# Patient Record
Sex: Female | Born: 1955 | Race: White | Hispanic: No | Marital: Married
Health system: Southern US, Community
[De-identification: ages and names within clinical notes are randomized; demographics above are authoritative.]

---

## 2008-08-28 ENCOUNTER — Ambulatory Visit: Payer: Self-pay | Admitting: Unknown Physician Specialty

## 2010-01-10 ENCOUNTER — Ambulatory Visit: Payer: Self-pay | Admitting: Unknown Physician Specialty

## 2010-09-04 ENCOUNTER — Ambulatory Visit: Payer: Self-pay

## 2011-08-01 ENCOUNTER — Ambulatory Visit: Payer: Self-pay

## 2011-08-21 ENCOUNTER — Ambulatory Visit: Payer: Self-pay | Admitting: Unknown Physician Specialty

## 2011-08-21 LAB — HEPATIC FUNCTION PANEL A (ARMC)
Alkaline Phosphatase: 121 U/L (ref 50–136)
Bilirubin,Total: 1.8 mg/dL — ABNORMAL HIGH (ref 0.2–1.0)
SGPT (ALT): 112 U/L — ABNORMAL HIGH
Total Protein: 6.8 g/dL (ref 6.4–8.2)

## 2011-08-21 LAB — CBC
HCT: 35.6 % (ref 35.0–47.0)
MCV: 101 fL — ABNORMAL HIGH (ref 80–100)
RBC: 3.53 10*6/uL — ABNORMAL LOW (ref 3.80–5.20)
WBC: 3.4 10*3/uL — ABNORMAL LOW (ref 3.6–11.0)

## 2011-08-21 LAB — URINALYSIS, COMPLETE
Bilirubin,UR: NEGATIVE
Blood: NEGATIVE
Glucose,UR: NEGATIVE mg/dL (ref 0–75)
Ketone: NEGATIVE
Leukocyte Esterase: NEGATIVE
Ph: 5 (ref 4.5–8.0)
RBC,UR: 2 /HPF (ref 0–5)
Specific Gravity: 1.025 (ref 1.003–1.030)
Squamous Epithelial: 1
WBC UR: 3 /HPF (ref 0–5)

## 2011-08-21 LAB — BASIC METABOLIC PANEL
BUN: 13 mg/dL (ref 7–18)
Calcium, Total: 8.3 mg/dL — ABNORMAL LOW (ref 8.5–10.1)
Chloride: 107 mmol/L (ref 98–107)
Co2: 23 mmol/L (ref 21–32)
Creatinine: 0.68 mg/dL (ref 0.60–1.30)
EGFR (African American): >60
EGFR (Non-African Amer.): >60
Glucose: 148 mg/dL — ABNORMAL HIGH (ref 65–99)
Osmolality: 288 (ref 275–301)

## 2011-08-22 ENCOUNTER — Ambulatory Visit: Payer: Self-pay | Admitting: Internal Medicine

## 2011-08-25 ENCOUNTER — Ambulatory Visit: Payer: Self-pay | Admitting: Oncology

## 2011-08-25 LAB — CBC CANCER CENTER
Basophil #: 0 x10 3/mm (ref 0.0–0.1)
Basophil %: 0.7 %
Eosinophil #: 0.1 x10 3/mm (ref 0.0–0.7)
Eosinophil %: 2.3 %
HCT: 39.4 % (ref 35.0–47.0)
HGB: 13.5 g/dL (ref 12.0–16.0)
Lymphocyte %: 35.7 %
MCHC: 34.2 g/dL (ref 32.0–36.0)
MCV: 100 fL (ref 80–100)
Monocyte #: 0.5 x10 3/mm (ref 0.0–0.7)
Neutrophil #: 2.7 x10 3/mm (ref 1.4–6.5)
Neutrophil %: 51.5 %
Platelet: 44 x10 3/mm — ABNORMAL LOW (ref 150–440)
RBC: 3.93 10*6/uL (ref 3.80–5.20)
WBC: 5.3 x10 3/mm (ref 3.6–11.0)

## 2011-08-25 LAB — IRON AND TIBC
Iron Bind.Cap.(Total): 272 ug/dL (ref 250–450)
Iron Saturation: 40 %
Iron: 108 ug/dL (ref 50–170)
Unbound Iron-Bind.Cap.: 164 ug/dL

## 2011-08-25 LAB — FERRITIN: Ferritin (ARMC): 194 ng/mL (ref 8–388)

## 2011-08-29 ENCOUNTER — Ambulatory Visit: Payer: Self-pay | Admitting: Oncology

## 2011-09-09 ENCOUNTER — Ambulatory Visit: Payer: Self-pay

## 2011-09-10 ENCOUNTER — Ambulatory Visit: Payer: Self-pay | Admitting: Unknown Physician Specialty

## 2011-09-11 ENCOUNTER — Ambulatory Visit: Payer: Self-pay | Admitting: Unknown Physician Specialty

## 2011-09-15 LAB — CBC CANCER CENTER
Eosinophil #: 0.5 x10 3/mm (ref 0.0–0.7)
HCT: 40.9 % (ref 35.0–47.0)
MCH: 34.6 pg — ABNORMAL HIGH (ref 26.0–34.0)
MCHC: 34.2 g/dL (ref 32.0–36.0)
Monocyte #: 0.5 x10 3/mm (ref 0.0–0.7)
Monocyte %: 5.5 %
Neutrophil #: 4.9 x10 3/mm (ref 1.4–6.5)
Neutrophil %: 57.9 %
Platelet: 74 x10 3/mm — ABNORMAL LOW (ref 150–440)

## 2011-09-29 ENCOUNTER — Ambulatory Visit: Payer: Self-pay | Admitting: Oncology

## 2011-10-20 LAB — CBC CANCER CENTER
Basophil #: 0 x10 3/mm (ref 0.0–0.1)
Eosinophil %: 3.6 %
HCT: 40 % (ref 35.0–47.0)
HGB: 13.1 g/dL (ref 12.0–16.0)
Lymphocyte %: 37.1 %
MCH: 33.5 pg (ref 26.0–34.0)
MCV: 102 fL — ABNORMAL HIGH (ref 80–100)
Monocyte #: 0.3 x10 3/mm (ref 0.2–0.9)
Monocyte %: 7.6 %
Platelet: 40 x10 3/mm — ABNORMAL LOW (ref 150–440)
RDW: 14.5 % (ref 11.5–14.5)
WBC: 3.4 x10 3/mm — ABNORMAL LOW (ref 3.6–11.0)

## 2011-10-29 ENCOUNTER — Ambulatory Visit: Payer: Self-pay | Admitting: Oncology

## 2011-11-03 LAB — CBC CANCER CENTER
Eosinophil #: 0.2 x10 3/mm (ref 0.0–0.7)
Eosinophil %: 3.8 %
HCT: 41.3 % (ref 35.0–47.0)
HGB: 13.6 g/dL (ref 12.0–16.0)
MCH: 33.4 pg (ref 26.0–34.0)
RBC: 4.07 10*6/uL (ref 3.80–5.20)
RDW: 14.7 % — ABNORMAL HIGH (ref 11.5–14.5)

## 2011-11-29 ENCOUNTER — Ambulatory Visit: Payer: Self-pay | Admitting: Oncology

## 2011-12-01 LAB — CBC CANCER CENTER
Basophil #: 0 x10 3/mm (ref 0.0–0.1)
Eosinophil #: 0.1 x10 3/mm (ref 0.0–0.7)
Eosinophil %: 3.1 %
Lymphocyte #: 1.3 x10 3/mm (ref 1.0–3.6)
Lymphocyte %: 39 %
MCHC: 33.3 g/dL (ref 32.0–36.0)
MCV: 102 fL — ABNORMAL HIGH (ref 80–100)
Monocyte #: 0.3 x10 3/mm (ref 0.2–0.9)
Monocyte %: 8.3 %
Platelet: 49 x10 3/mm — ABNORMAL LOW (ref 150–440)
RDW: 15.4 % — ABNORMAL HIGH (ref 11.5–14.5)
WBC: 3.4 x10 3/mm — ABNORMAL LOW (ref 3.6–11.0)

## 2011-12-15 LAB — CBC CANCER CENTER
Basophil #: 0 x10 3/mm (ref 0.0–0.1)
Basophil %: 1 %
Eosinophil %: 3.5 %
HGB: 12.8 g/dL (ref 12.0–16.0)
Lymphocyte %: 45.3 %
Monocyte #: 0.3 x10 3/mm (ref 0.2–0.9)
Monocyte %: 8.6 %
Neutrophil %: 41.6 %
RBC: 3.72 10*6/uL — ABNORMAL LOW (ref 3.80–5.20)
RDW: 15.3 % — ABNORMAL HIGH (ref 11.5–14.5)
WBC: 3.6 x10 3/mm (ref 3.6–11.0)

## 2011-12-29 ENCOUNTER — Ambulatory Visit: Payer: Self-pay | Admitting: Oncology

## 2012-03-16 ENCOUNTER — Ambulatory Visit: Payer: Self-pay | Admitting: Oncology

## 2012-03-16 LAB — CBC CANCER CENTER
Basophil #: 0 x10 3/mm (ref 0.0–0.1)
Basophil %: 1.1 %
Eosinophil #: 0.2 x10 3/mm (ref 0.0–0.7)
HGB: 13.2 g/dL (ref 12.0–16.0)
Lymphocyte %: 36.4 %
MCHC: 34.3 g/dL (ref 32.0–36.0)
MCV: 100 fL (ref 80–100)
Neutrophil %: 49.2 %
Platelet: 46 x10 3/mm — ABNORMAL LOW (ref 150–440)
RBC: 3.83 10*6/uL (ref 3.80–5.20)
RDW: 15 % — ABNORMAL HIGH (ref 11.5–14.5)

## 2012-03-30 ENCOUNTER — Ambulatory Visit: Payer: Self-pay | Admitting: Oncology

## 2012-05-18 ENCOUNTER — Ambulatory Visit: Payer: Self-pay | Admitting: Oncology

## 2012-05-18 LAB — CBC CANCER CENTER
Basophil #: 0 x10 3/mm (ref 0.0–0.1)
Basophil %: 1 %
Eosinophil %: 3.8 %
HGB: 12.9 g/dL (ref 12.0–16.0)
Lymphocyte %: 31.6 %
MCV: 100 fL (ref 80–100)
Monocyte %: 8.5 %
Neutrophil %: 55.1 %
RBC: 3.94 10*6/uL (ref 3.80–5.20)
WBC: 4.6 x10 3/mm (ref 3.6–11.0)

## 2012-05-30 ENCOUNTER — Ambulatory Visit: Payer: Self-pay | Admitting: Oncology

## 2012-06-11 LAB — CBC CANCER CENTER
Basophil #: 0 x10 3/mm (ref 0.0–0.1)
Basophil %: 1.1 %
Eosinophil #: 0.1 x10 3/mm (ref 0.0–0.7)
HCT: 37.1 % (ref 35.0–47.0)
HGB: 12.9 g/dL (ref 12.0–16.0)
Lymphocyte #: 1.5 x10 3/mm (ref 1.0–3.6)
Lymphocyte %: 36.6 %
MCH: 34.1 pg — ABNORMAL HIGH (ref 26.0–34.0)
MCHC: 34.7 g/dL (ref 32.0–36.0)
Monocyte %: 8.5 %
Neutrophil #: 2.1 x10 3/mm (ref 1.4–6.5)
RDW: 15.9 % — ABNORMAL HIGH (ref 11.5–14.5)
WBC: 4 x10 3/mm (ref 3.6–11.0)

## 2012-06-30 ENCOUNTER — Ambulatory Visit: Payer: Self-pay | Admitting: Oncology

## 2012-09-09 ENCOUNTER — Ambulatory Visit: Payer: Self-pay | Admitting: Oncology

## 2012-09-29 ENCOUNTER — Ambulatory Visit: Payer: Self-pay | Admitting: Medical

## 2013-01-05 ENCOUNTER — Observation Stay: Payer: Self-pay | Admitting: Internal Medicine

## 2013-01-05 LAB — COMPREHENSIVE METABOLIC PANEL WITH GFR
Albumin: 2.5 g/dL — ABNORMAL LOW
Alkaline Phosphatase: 126 U/L
Anion Gap: 14
BUN: 22 mg/dL — ABNORMAL HIGH
Bilirubin,Total: 4.6 mg/dL — ABNORMAL HIGH
Calcium, Total: 8.3 mg/dL — ABNORMAL LOW
Chloride: 108 mmol/L — ABNORMAL HIGH
Co2: 18 mmol/L — ABNORMAL LOW
Creatinine: 0.92 mg/dL
EGFR (African American): >60
EGFR (Non-African Amer.): >60
Glucose: 113 mg/dL — ABNORMAL HIGH
Osmolality: 284
Potassium: 3.2 mmol/L — ABNORMAL LOW
SGOT(AST): 60 U/L — ABNORMAL HIGH
SGPT (ALT): 51 U/L
Sodium: 140 mmol/L
Total Protein: 5.3 g/dL — ABNORMAL LOW

## 2013-01-05 LAB — LIPASE, BLOOD: Lipase: 115 U/L

## 2013-01-05 LAB — CBC
HCT: 37.8 %
HGB: 12.8 g/dL
MCH: 34.4 pg — ABNORMAL HIGH
MCHC: 33.9 g/dL
MCV: 101 fL — ABNORMAL HIGH
Platelet: 46 x10 3/mm 3 — ABNORMAL LOW
RBC: 3.73 X10 6/mm 3 — ABNORMAL LOW
RDW: 17.5 % — ABNORMAL HIGH
WBC: 6.9 x10 3/mm 3

## 2013-01-05 LAB — PROTIME-INR
INR: 1.6
Prothrombin Time: 19.3 s — ABNORMAL HIGH

## 2013-01-06 LAB — URINALYSIS, COMPLETE
Ketone: NEGATIVE
Leukocyte Esterase: NEGATIVE
Ph: 6 (ref 4.5–8.0)
Protein: NEGATIVE
Specific Gravity: 1.025 (ref 1.003–1.030)
Squamous Epithelial: 1

## 2013-01-06 LAB — BASIC METABOLIC PANEL
BUN: 20 mg/dL — ABNORMAL HIGH (ref 7–18)
Calcium, Total: 7.8 mg/dL — ABNORMAL LOW (ref 8.5–10.1)
Chloride: 111 mmol/L — ABNORMAL HIGH (ref 98–107)
Co2: 24 mmol/L (ref 21–32)
EGFR (African American): >60
EGFR (Non-African Amer.): >60
Sodium: 141 mmol/L (ref 136–145)

## 2013-01-07 LAB — BASIC METABOLIC PANEL
Anion Gap: 5 — ABNORMAL LOW (ref 7–16)
BUN: 21 mg/dL — ABNORMAL HIGH (ref 7–18)
Calcium, Total: 8.1 mg/dL — ABNORMAL LOW (ref 8.5–10.1)
EGFR (African American): >60
EGFR (Non-African Amer.): >60
Glucose: 79 mg/dL (ref 65–99)
Osmolality: 278 (ref 275–301)
Potassium: 4 mmol/L (ref 3.5–5.1)

## 2013-01-07 LAB — CBC WITH DIFFERENTIAL/PLATELET
Basophil #: 0 10*3/uL (ref 0.0–0.1)
Eosinophil #: 0 10*3/uL (ref 0.0–0.7)
Eosinophil %: 0.2 %
HCT: 35.4 % (ref 35.0–47.0)
HGB: 12.2 g/dL (ref 12.0–16.0)
Lymphocyte #: 0.5 10*3/uL — ABNORMAL LOW (ref 1.0–3.6)
MCH: 35 pg — ABNORMAL HIGH (ref 26.0–34.0)
MCHC: 34.4 g/dL (ref 32.0–36.0)
MCV: 102 fL — ABNORMAL HIGH (ref 80–100)
Monocyte #: 0.3 x10 3/mm (ref 0.2–0.9)
Monocyte %: 5.9 %
Neutrophil %: 85.6 %
Platelet: 36 10*3/uL — ABNORMAL LOW (ref 150–440)
RBC: 3.48 10*6/uL — ABNORMAL LOW (ref 3.80–5.20)

## 2013-01-08 LAB — BASIC METABOLIC PANEL
Anion Gap: 3 — ABNORMAL LOW (ref 7–16)
BUN: 23 mg/dL — ABNORMAL HIGH (ref 7–18)
Calcium, Total: 8 mg/dL — ABNORMAL LOW (ref 8.5–10.1)
Chloride: 105 mmol/L (ref 98–107)
EGFR (African American): >60
EGFR (Non-African Amer.): >60
Glucose: 118 mg/dL — ABNORMAL HIGH (ref 65–99)
Osmolality: 275 (ref 275–301)
Potassium: 4.7 mmol/L (ref 3.5–5.1)

## 2013-01-08 LAB — CBC WITH DIFFERENTIAL/PLATELET
Basophil #: 0 10*3/uL (ref 0.0–0.1)
Eosinophil %: 0.1 %
HCT: 38 % (ref 35.0–47.0)
HGB: 13 g/dL (ref 12.0–16.0)
Lymphocyte %: 6 %
MCHC: 34.1 g/dL (ref 32.0–36.0)
Platelet: 38 10*3/uL — ABNORMAL LOW (ref 150–440)
WBC: 6.9 10*3/uL (ref 3.6–11.0)

## 2013-01-09 ENCOUNTER — Inpatient Hospital Stay: Payer: Self-pay | Admitting: Internal Medicine

## 2013-01-09 LAB — CBC
MCH: 34.2 pg — ABNORMAL HIGH (ref 26.0–34.0)
MCHC: 33.9 g/dL (ref 32.0–36.0)
MCV: 101 fL — ABNORMAL HIGH (ref 80–100)
Platelet: 42 10*3/uL — ABNORMAL LOW (ref 150–440)
RBC: 4.25 10*6/uL (ref 3.80–5.20)
WBC: 13.8 10*3/uL — ABNORMAL HIGH (ref 3.6–11.0)

## 2013-01-09 LAB — COMPREHENSIVE METABOLIC PANEL
Albumin: 2.1 g/dL — ABNORMAL LOW (ref 3.4–5.0)
Alkaline Phosphatase: 152 U/L — ABNORMAL HIGH (ref 50–136)
Anion Gap: 4 — ABNORMAL LOW (ref 7–16)
BUN: 25 mg/dL — ABNORMAL HIGH (ref 7–18)
Bilirubin,Total: 5.6 mg/dL — ABNORMAL HIGH (ref 0.2–1.0)
Calcium, Total: 8.4 mg/dL — ABNORMAL LOW (ref 8.5–10.1)
Co2: 27 mmol/L (ref 21–32)
Creatinine: 0.78 mg/dL (ref 0.60–1.30)
EGFR (African American): >60
EGFR (Non-African Amer.): >60
Osmolality: 277 (ref 275–301)
SGOT(AST): 450 U/L — ABNORMAL HIGH (ref 15–37)
SGPT (ALT): 225 U/L — ABNORMAL HIGH (ref 12–78)
Sodium: 136 mmol/L (ref 136–145)
Total Protein: 5 g/dL — ABNORMAL LOW (ref 6.4–8.2)

## 2013-01-09 LAB — PROTIME-INR: Prothrombin Time: 19.9 secs — ABNORMAL HIGH (ref 11.5–14.7)

## 2013-01-09 LAB — MAGNESIUM: Magnesium: 2 mg/dL

## 2013-01-09 LAB — TROPONIN I: Troponin-I: <0.02 ng/mL

## 2013-01-09 LAB — APTT: Activated PTT: 27.5 secs (ref 23.6–35.9)

## 2013-01-10 LAB — COMPREHENSIVE METABOLIC PANEL
Albumin: 1.9 g/dL — ABNORMAL LOW (ref 3.4–5.0)
Anion Gap: 6 — ABNORMAL LOW (ref 7–16)
BUN: 27 mg/dL — ABNORMAL HIGH (ref 7–18)
Bilirubin,Total: 6.8 mg/dL — ABNORMAL HIGH (ref 0.2–1.0)
Co2: 26 mmol/L (ref 21–32)
Creatinine: 0.73 mg/dL (ref 0.60–1.30)
EGFR (African American): >60
EGFR (Non-African Amer.): >60
Glucose: 83 mg/dL (ref 65–99)
Osmolality: 276 (ref 275–301)
Potassium: 4.4 mmol/L (ref 3.5–5.1)
SGPT (ALT): 358 U/L — ABNORMAL HIGH (ref 12–78)
Sodium: 136 mmol/L (ref 136–145)
Total Protein: 4.7 g/dL — ABNORMAL LOW (ref 6.4–8.2)

## 2013-01-10 LAB — CBC WITH DIFFERENTIAL/PLATELET
Basophil %: 0.1 %
Eosinophil #: 0 10*3/uL (ref 0.0–0.7)
Eosinophil %: 0.3 %
HCT: 39.6 % (ref 35.0–47.0)
Monocyte #: 0.5 x10 3/mm (ref 0.2–0.9)
Monocyte %: 4.5 %
Neutrophil #: 9.8 10*3/uL — ABNORMAL HIGH (ref 1.4–6.5)
RDW: 17.6 % — ABNORMAL HIGH (ref 11.5–14.5)
WBC: 11.2 10*3/uL — ABNORMAL HIGH (ref 3.6–11.0)

## 2013-01-14 LAB — CULTURE, BLOOD (SINGLE)

## 2013-01-28 DEATH — deceased

## 2013-04-06 IMAGING — US ABDOMEN ULTRASOUND
1 series · 13 of 25 positions shown · non-contrast
Comparison: none

REASON FOR EXAM: generalized abd pain swelling
COMMENTS:

PROCEDURE:     US  - US ABDOMEN GENERAL SURVEY  - September 29, 2012 [DATE]
RESULT:     Comparison: None
TECHNIQUE: Multiple gray-scale and color-flow Doppler images of the abdomen
are presented for review.

[Series 1: abdomen ultrasound · 0.26mm/px · 13 of 118 slices shown]
[im 1/118]
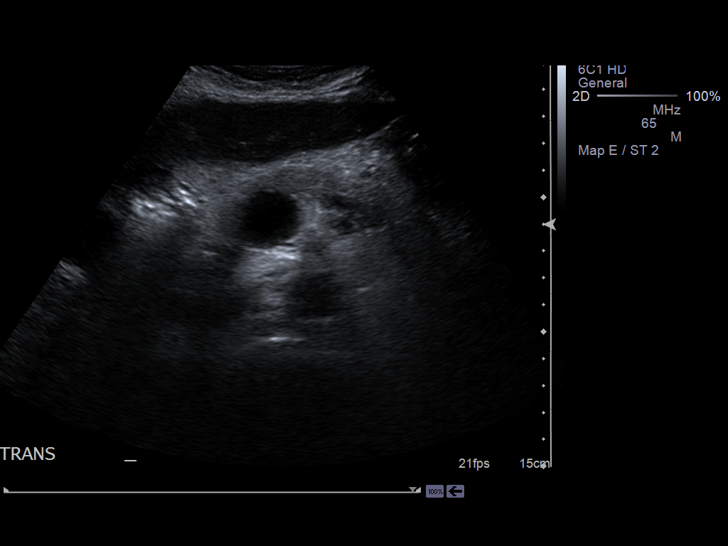
[im 10/118]
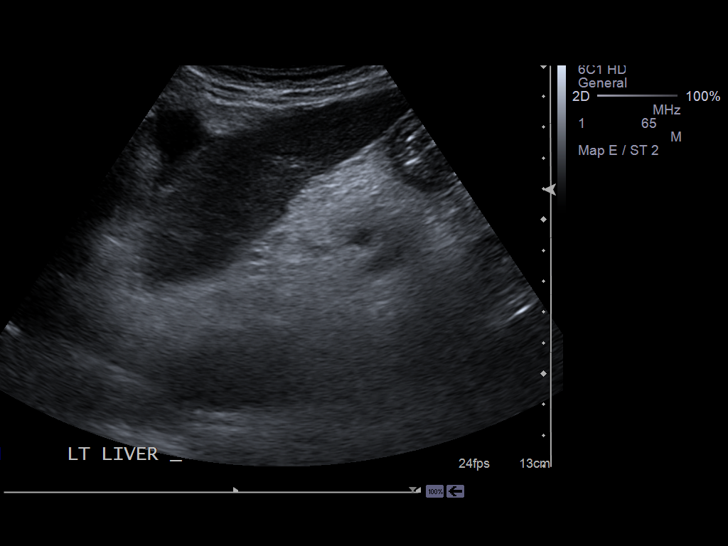
[im 20/118]
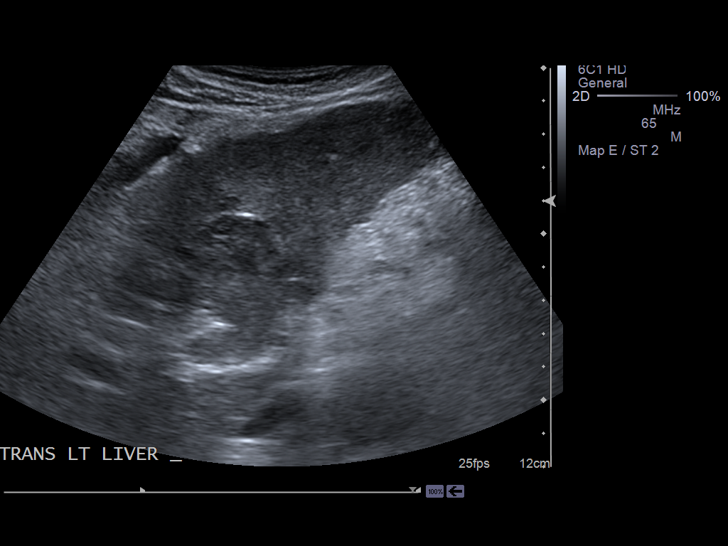
[im 30/118]
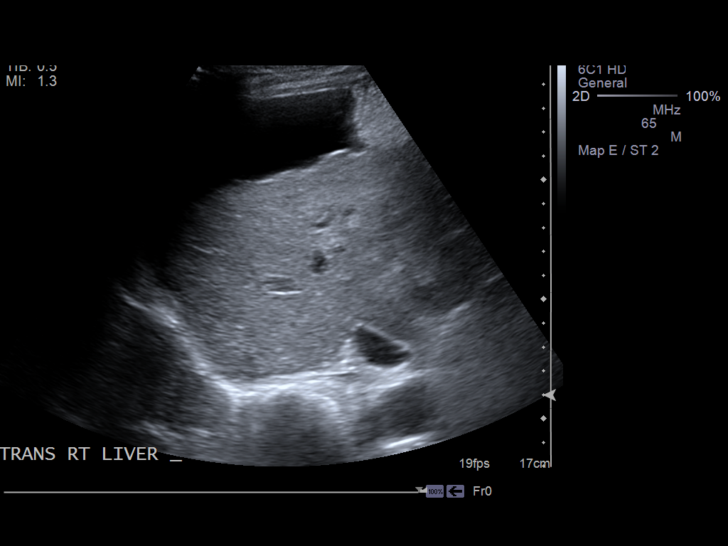
[im 40/118]
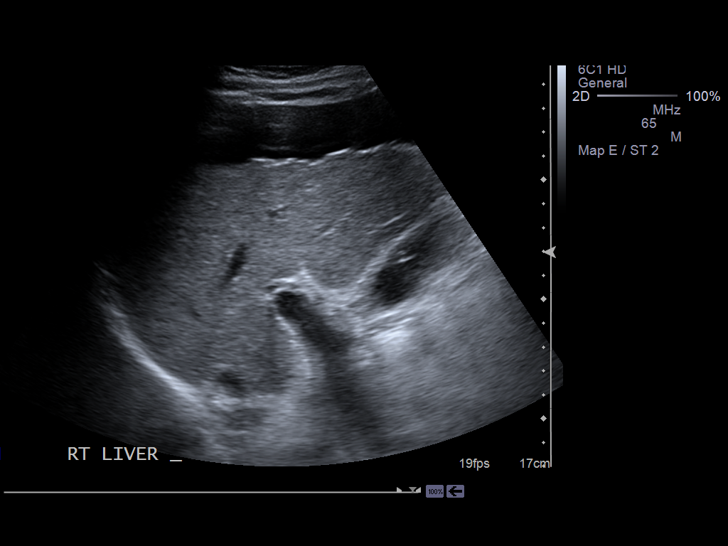
[im 49/118]
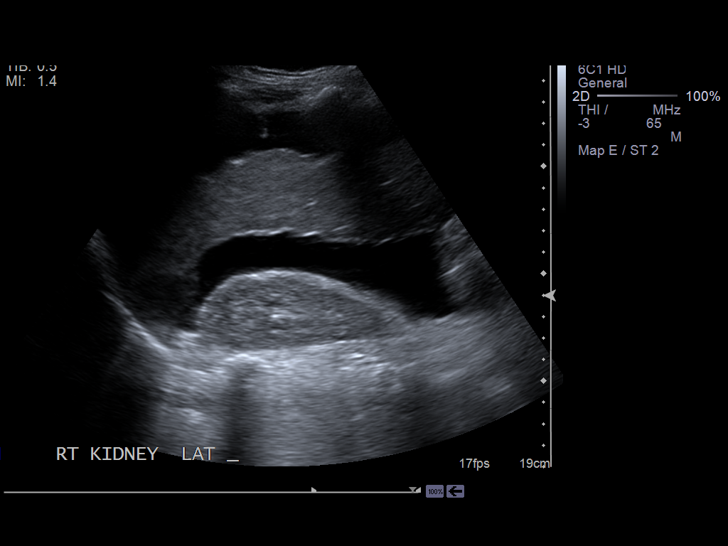
[im 59/118]
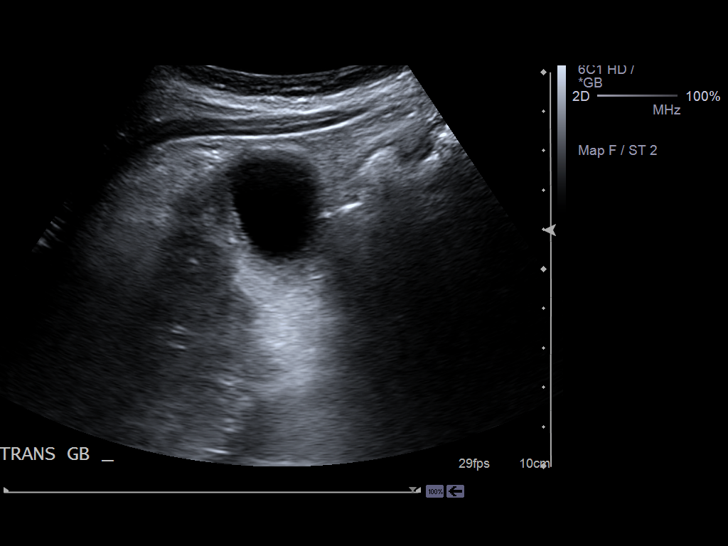
[im 69/118]
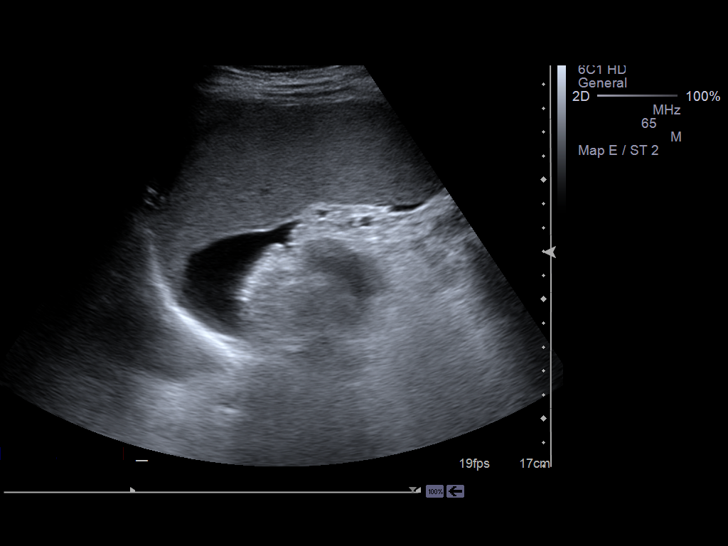
[im 79/118]
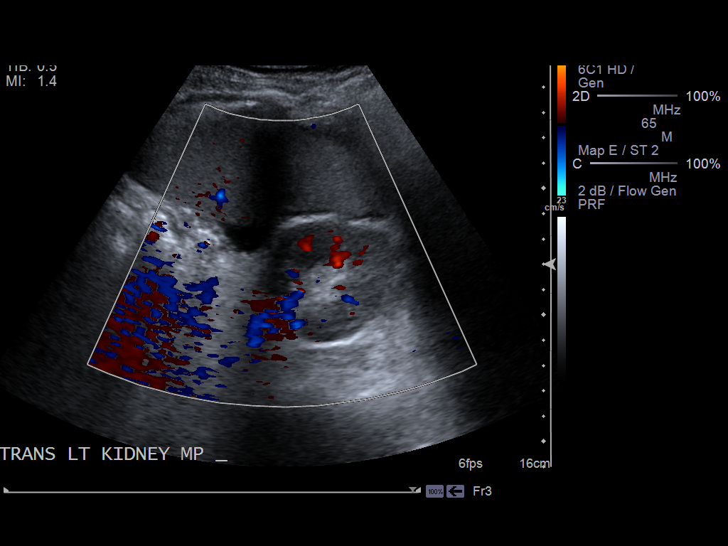
[im 88/118]
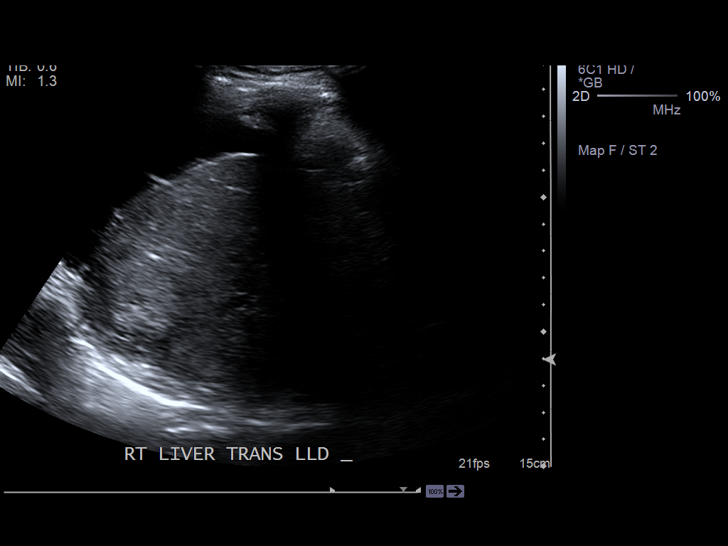
[im 98/118]
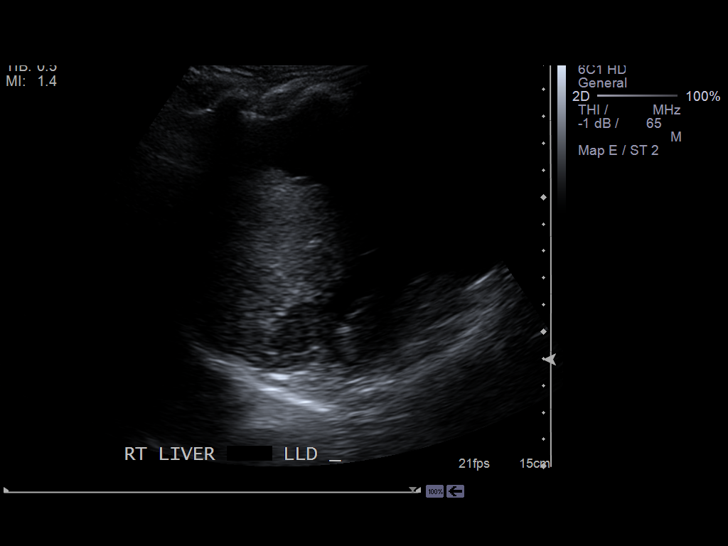
[im 108/118]
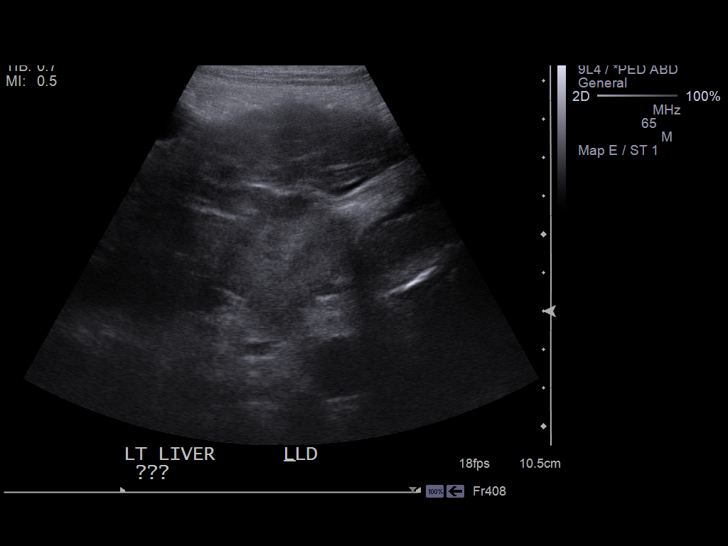
[im 118/118]
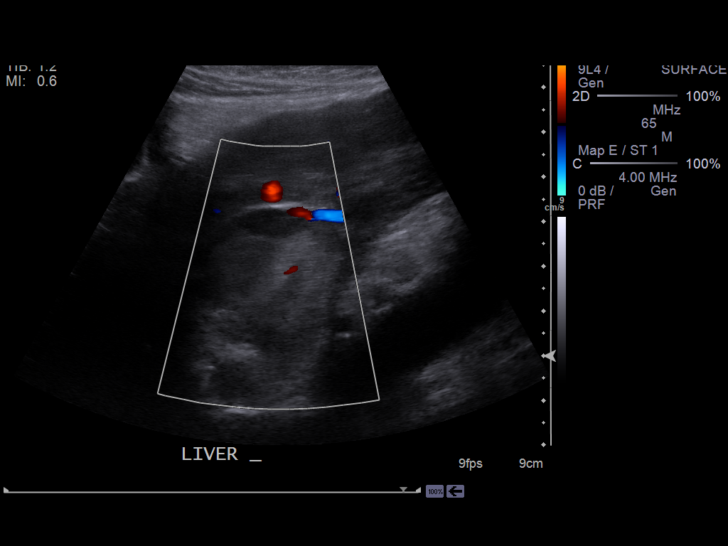

[13 of 25 positions shown; findings below may reference images not displayed]

FINDINGS: The liver demonstrates a micronodular contour or as can be seen with
cirrhosis. There are 2 hypoechoic hepatic mass is. The right hepatic lobe
mass measures 3.6 x 3.1 x 2.5 cm. The left hepatic lobe mass measures 4.2 x
2.9 x 2.7 cm.

There is no cholelithiasis or biliary sludge. There is no intra- or
extrahepatic biliary ductal dilatation. The common duct measures 2.8 mm in
maximal diameter. There is no sonographic Murphy's sign. There is
gallbladder wall thickening measuring 5.8 mm which can be seen in the
presence of cirrhosis and ascites.

The uterus is visualized. The spleen is enlarged. Bilateral kidneys are
normal in echogenicity and size. The right kidney measures 11.6 x 5.1 x
cm. The left kidney measures 13.8 x 4.7 x 4.6 cm. There are no renal calculi
or hydronephrosis. The abdominal aorta and IVC are unremarkable.
IMPRESSION: 1. No cholelithiasis. There is gallbladder wall thickening measuring 5.8 mm
which can be seen in the presence of cirrhosis and ascites.

2. There are 2 hypoechoic hepatic masses, one in the right hepatic lobe and
one the left hepatic lobe. The recommend further evaluation with a
multiphasic CT or MRI of the abdomen.

3. Hepatosplenomegaly. Cirrhotic liver.

[REDACTED]

## 2013-07-17 IMAGING — CR DG CHEST 1V PORT
2 series · 3 of 3 positions shown · non-contrast
Comparison: none

REASON FOR EXAM: cough
COMMENTS:

PROCEDURE:     DXR - DXR PORTABLE CHEST SINGLE VIEW  - January 09, 2013 [DATE]
RESULT:     There is perihilar increased density concerning for edema versus
infiltrate. Heart is normal. There is no effusion or pneumothorax. In the
bony structures appear intact.

[ap (1 of 2)]
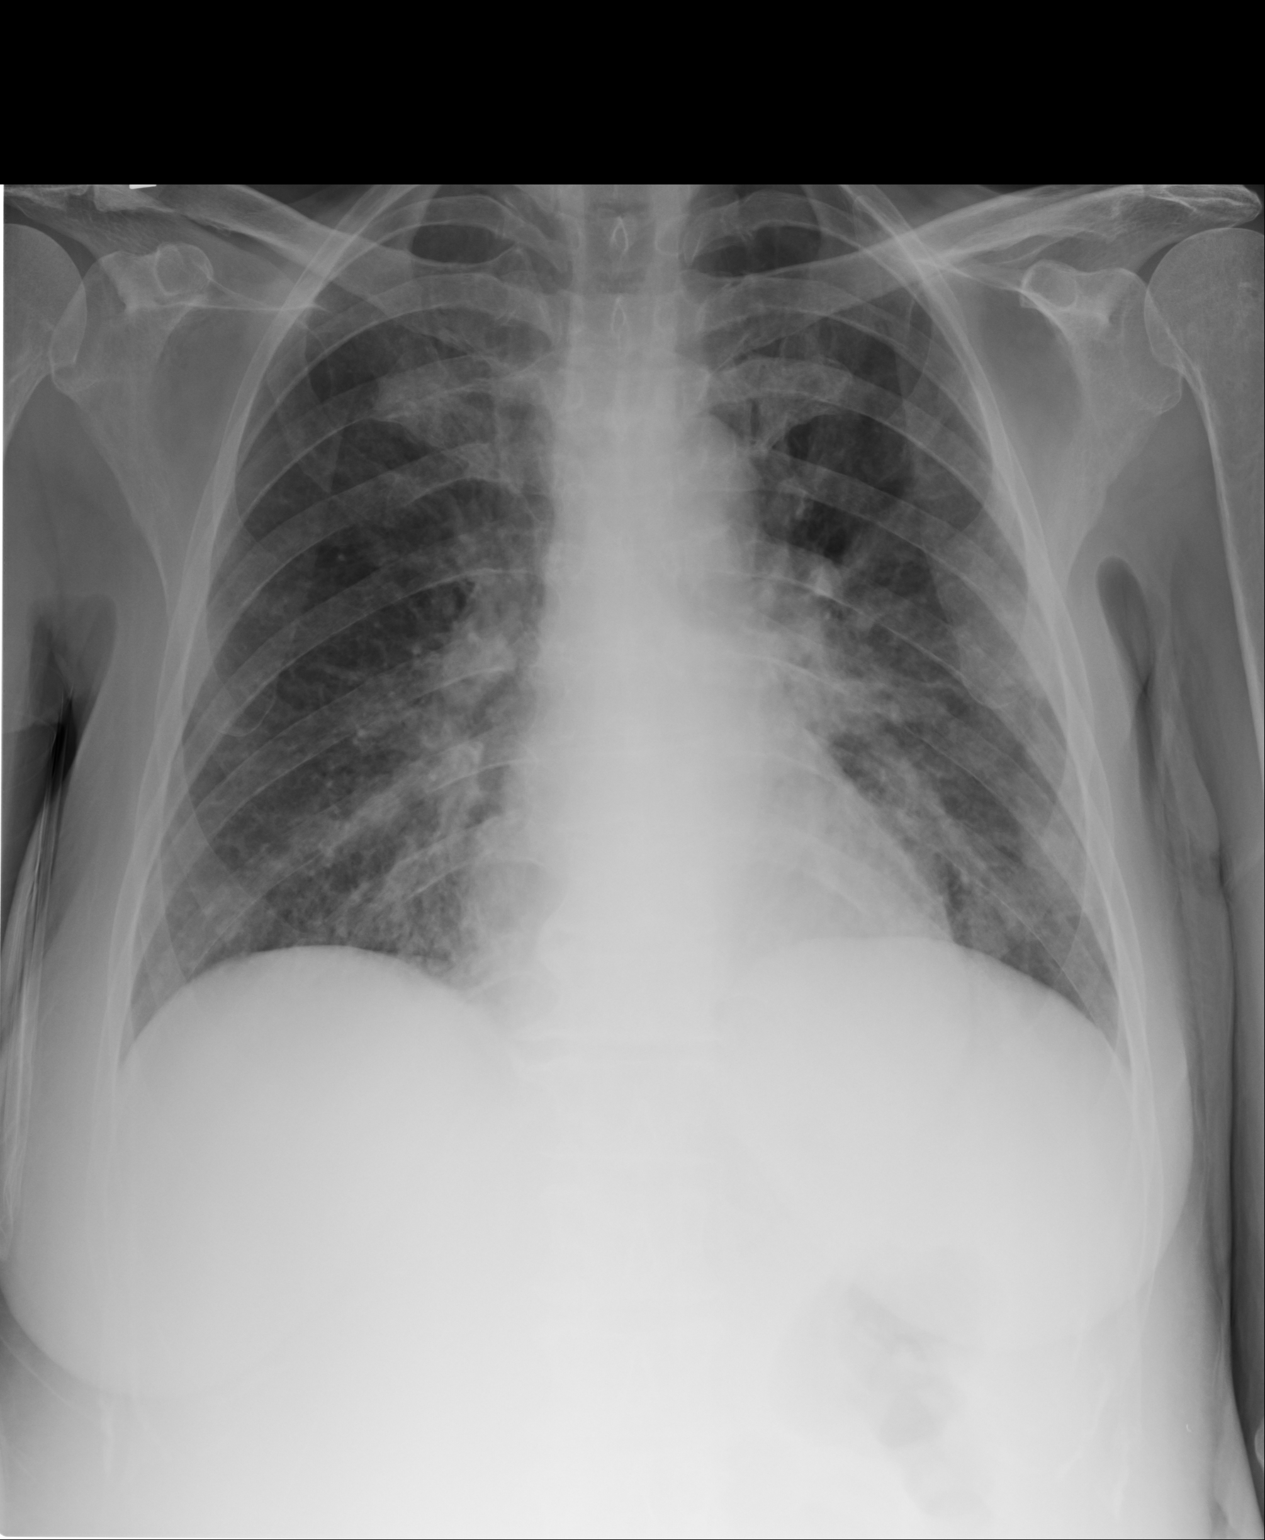

[Series 1: ap · 0.17mm/px · 2 of 2 slices shown (2 of 2)]
[im 1/2]
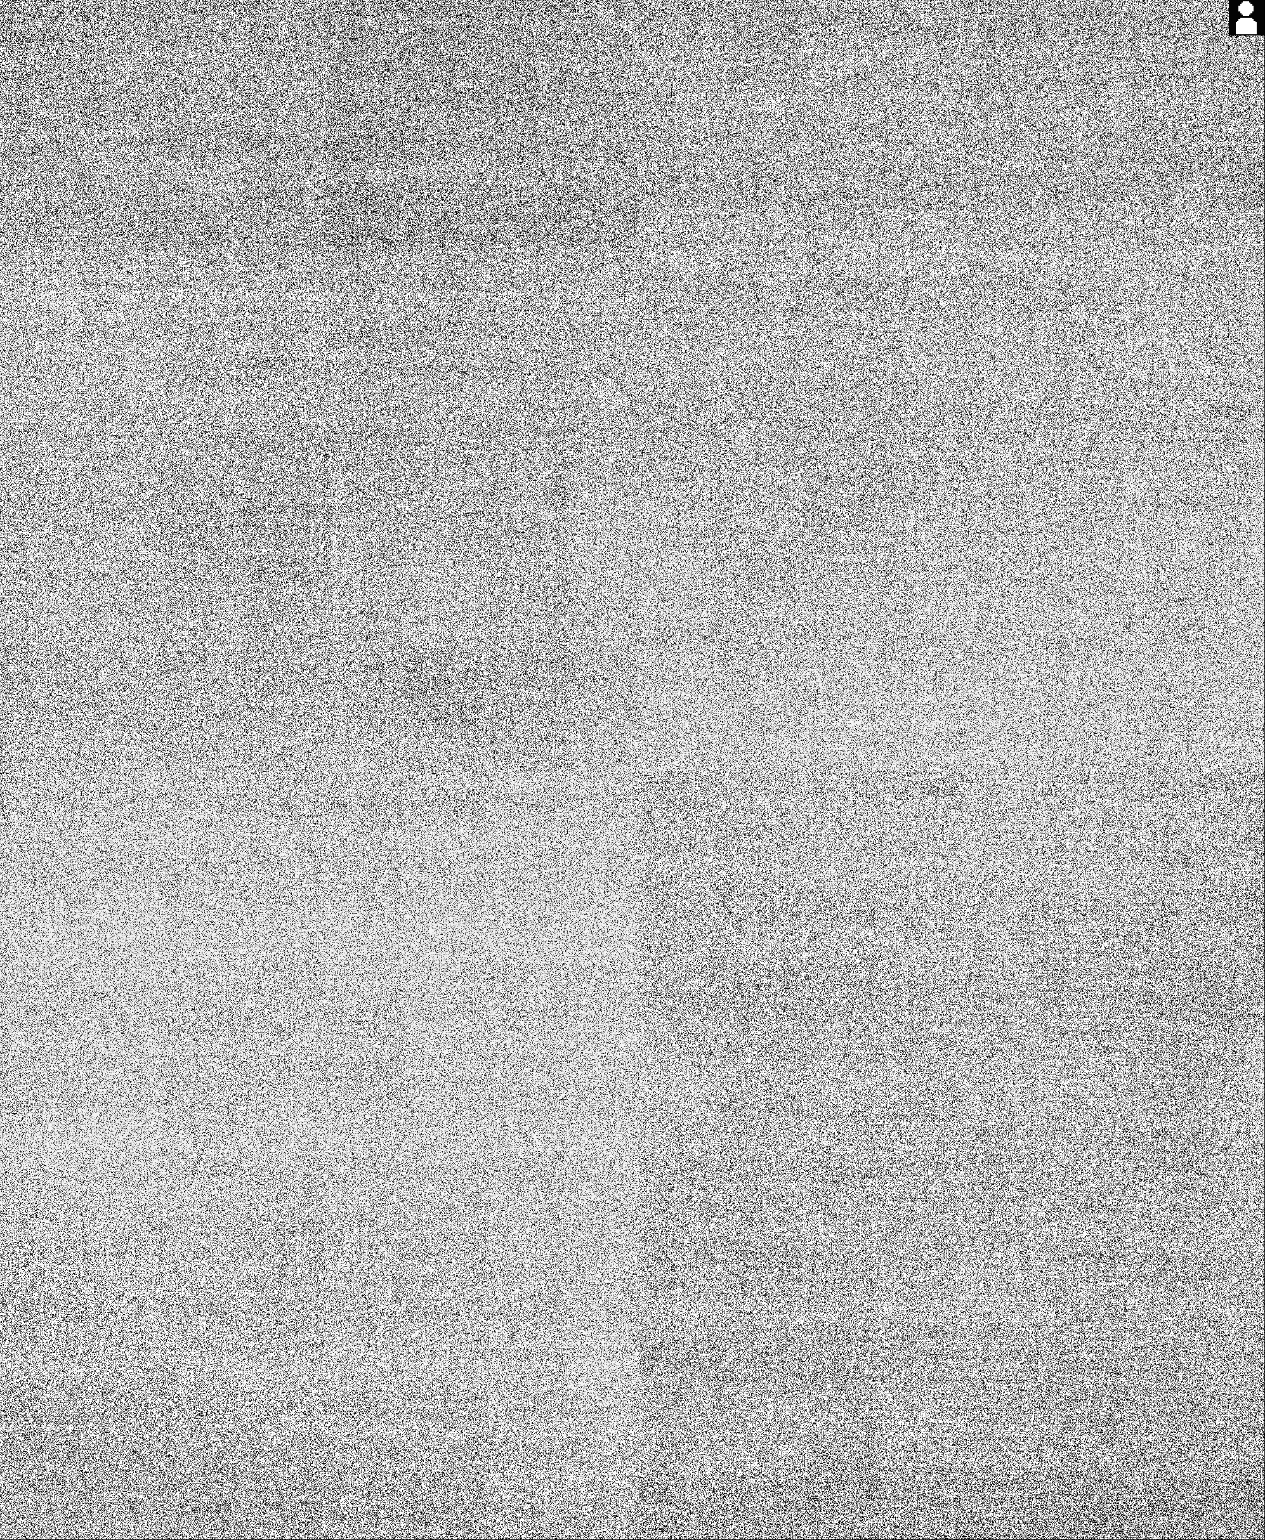
[im 2/2]
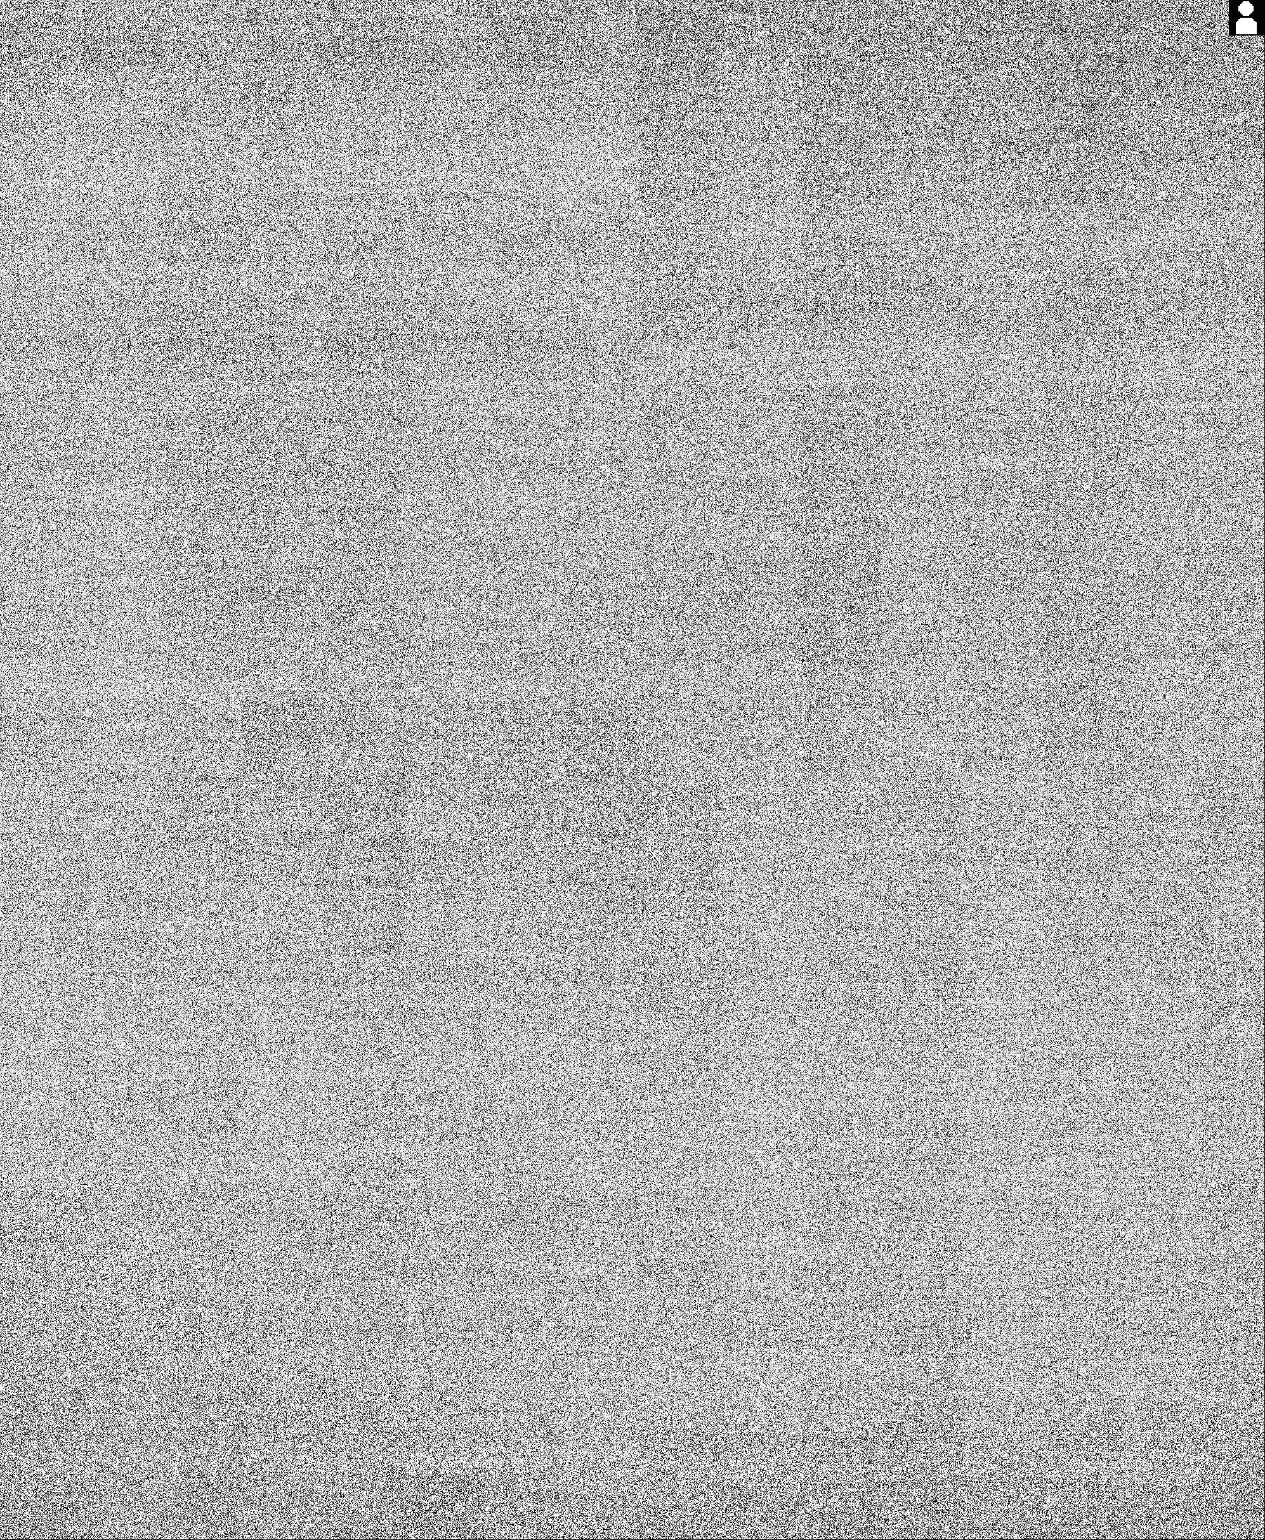

[3 of 3 positions shown; findings below may reference images not displayed]

IMPRESSION: Perihilar hazy increased density concerning for perihilar
edema versus infiltrate. Correlate with clinical and laboratory data.
Followup PA and lateral views the chest would be recommended.

[REDACTED]

## 2014-10-20 NOTE — H&P (Signed)
PATIENT NAME:  Kristy Cordova, Kristy Cordova MR#:  [id number] DATE OF BIRTH:  10-Oct-1955  DATE OF ADMISSION:  01/09/2013  REFERRING PHYSICIAN: Malachy Moan, MD   CHIEF COMPLAINT: Status post fall, feeling weaker, fever at home, increased shortness of breath.   HISTORY OF PRESENT ILLNESS: This is a very nice 59 year old female who has history of being admitted over here on 01/05/2013 and discharged on 01/08/2013, returning on 01/09/2013. The patient has a history of unresectable hepatocellular carcinoma diagnosed 2 months ago at [facility] and started on Nexavar 200 mg twice daily. The patient stopped that medication due to severe nausea over the last 5 days. The patient has chronic pain, GERD, alcoholic cirrhosis of the liver, osteoarthritis and hepatitis C.  The patient was admitted and discharged yesterday with a diagnosis of intractable nausea, vomiting, dehydration with COPD flare and hepatocellular carcinoma with chronic ascites.   The patient was discharged home in company of her brother, who was going to take care of her, and,  unfortunately, she needed to come back to the hospital. Apparently, the patient was doing okay. Yesterday she was ambulating some, had dinner, went to bed, and this morning she could not get up. Whenever she did try to get up, she fell and hit her chin on the floor and had an open wound. The patient has been increasing his shortness of breath, increased cough, increased secretions.  Now they are yellow secretions. The patient's family stated she had a fever at home.  They did not quantify, she just felt really warm, and they came here to arrange her care.   Apparently, a Child psychotherapist at the ER was trying to arrange her to go home with hospice. The family agreed to that, and then whenever EMS came to pick them up they actually changed their mind and they were pretty upset about the decision of being discharged from the ER. After I talked to them, they said that they do want her to  go home and have hospice services there, but they just do not feel ready to go today as she will need possibly a bed, and I put a Foley catheter as she is not going to get up and move around.   REVIEW OF SYSTEMS: Unable to obtain a full review of systems  as the patient is lethargic, but from the family I can tell that she has been coughing more, had a fever and she is very weak.   PAST MEDICAL HISTORY: 1.  Unresectable hepatocellular carcinoma diagnosed 2 months ago at [facility], on chemotherapy that stopped 5 or 6 days ago.  2.  GERD.  3.  Alcoholic cirrhosis of the liver.  4.  Hepatitis C.  5.  Osteoarthritis.  6.  Portal hypertension.  7.  Thrombocytopenia.  8.  Chronic pain.  9.  Recent admission for intractable nausea and vomiting likely due to chemotherapy.  10.  COPD.   11.  Status post COPD exacerbation.   PAST SURGICAL HISTORY: 1.  Bilateral carpal tunnel release.  2.  Bilateral foot surgery.   ALLERGIES: THE PATIENT IS ALLERGIC TO BENADRYL, ASPIRIN, PENICILLIN, SULFAS, SUCCINYLCHOLINE.    SOCIAL HISTORY: The patient smokes 1/2 pack a day. She lives by herself. She is separated from her husband. She denies any recent alcohol use. She quit in January of this year. The patient was discharged yesterday from the hospital, and she went to live with her brother. At this moment, the brother and family do not feel equipped to  take care of her.   FAMILY HISTORY: Lung cancer in her father, melanoma in her brother, ovarian cancer in her mother.   CURRENT MEDICATIONS: Ranitidine 300 mg once a day, prednisone taper, OxyContin 40 mg 2 tablets once a day, oxycodone 10 mg every 6 hours, Zofran 8 mg 2 times a day as needed for nausea. Levaquin 500 mg every 24 hours, guaifenesin 600 mg 2 times a day, cyclobenzaprine 10 mg twice daily.   PHYSICAL EXAMINATION: VITAL SIGNS: Blood pressure 131/76, pulse 100, respirations 18, temperature has not been recorded. The patient's family said that she was above  100 at home. Pulse oxygen is 92% on 3 liters nasal cannula. The patient was discharged without oxygen home.  PHYSICAL EXAMINATION: VITAL SIGNS: Blood pressure 131/76, pulse 100, respirations 18, temperature not recorded, as per family, she was at home 100 degrees. Oxygen saturation 92 on 3 to 4 liters.  GENERAL: The patient is lethargic, obtunded. She opens her eyes and says a couple of phrases whenever she is stimulated. She looks critically ill. She looks severely dehydrated. She is very cachectic, ill-looking, toxic-looking and jaundiced.  HEENT:  Atraumatic, normocephalic. Pupils are equal and reactive. Extraocular movements are intact. The patient follows commands. Mucosa are very dry. No oral lesions. No thrush.  NECK: Supple. No JVD. No thyromegaly. No adenopathy. No carotid bruits.  CARDIOVASCULAR: Regular rate and rhythm. No murmurs, rubs, or gallops are appreciated. She is tachycardic. PMI is nondisplaced. Chest wall is nontender.  LUNGS: Significant rales on bilateral lung fields, right more than left. No wheezing at this moment. The patient is not using accessory muscles, but she is having very shallow breathing with decreased air entrance in both lung fields. No dullness to percussion.  ABDOMEN: Soft, nontender, nondistended. No hepatosplenomegaly. No masses. The patient has a history of ascites.  Ascitic wave is palpable.  GENITAL: Deferred.  EXTREMITIES: Edema +1 on both lower extremities, which is chronic.  VASCULAR: Pulses +2. Capillary refill less than 3.  MUSCULOSKELETAL: No significant joint deformity or joint effusion.  SKIN: Positive jaundice, very dry and warm. No diaphoresis.  PSYCHIATRIC: The patient is very lethargic, but when awake she knows where she is. She knows her name. She does not know exactly the time, but she knows in general that it is Sunday.  LYMPHATIC: Negative for lymphadenopathy in the neck or supraclavicular areas.   LABORATORY AND RADIOLOGICAL DATA:  The  patient had a glucose of 112, BUN 25, creatinine of 0.78, sodium is 136, potassium is 4.3, total protein 5, albumin 2.1. Her AST went from 60 up to 450 since the 9th to the 13th, so her AST went from 60 to 450, her ALT went from 51 to 225. Her bilirubin went from 0.6 to 5.6, and her alkaline phosphatase was 152. White blood cells are not available at this moment as her sample hemolyzed. She needs to repeat the CBC. INR is 1.6, but repeat INR is not available. The 1.6 was on July 9th . Urinalysis is currently pending. Chest x-ray, worsening bilateral infiltrates, possibility of perihilar edema versus infection. Clinically, the patient showed more signs that are guiding the diagnosis as pneumonia.   ASSESSMENT AND PLAN: A 59 year old female recently admitted with a diagnosis of intractable nausea and vomiting due to chemotherapy secondary to hepatocellular carcinoma. The patient was discharged yesterday, came back today.  1.  Status post fall:  The patient fell. She was really weak.  She is weaker than she was yesterday. She was discharged  home. There were no issues as far as going into a skilled nursing facility or going into rehab as the patient was ambulated, but today she cannot. She is much weaker and she is unbalanced.  2.  Acute respiratory failure:  The patient is requiring 3 to 4 liters of oxygen. Her oxygen saturation is currently 7392 with that. The patient likely has this secondary to pneumonia.  3.  Pneumonia:  At this moment, since the patient was hospitalized for several days, and since she is a patient who has cancer immunosuppressed, she was given steroids, and she was given also antibiotics in the hospital. We are going to treat it as a hospital-acquired pneumonia. We are going to give her cefepime and levofloxacin as the patient is allergic to penicillins but not sensitive to cephalosporins.  I am going to hold on giving her vancomycin for now.  4.  Hepatocellular carcinoma:  Nonoperative,  unresectable.  The  patient is actually looking for hospice care. We are going to give her 24 hours of treatment to see how she progresses, and the family wants her to get IV fluids and antibiotics for now, but she is going to be a DNR. Possibilities are going into Hospice Home if her course worsens versus going home with hospice.  If she goes home with hospice, she will require a bed.  She will require a permanent Foley and comfort care. For now, she is not quite comfort care.  She will be a DNR though.   5.  Dehydration: IV fluids.  6.  Liver failure:  The patient has worsening hyperbilirubinemia, worsening hepatic function tests likely due to her cancer.  7.  Thrombocytopenia:  Likely secondary to cirrhosis. We are waiting to check her CBC.  8.  Coagulopathy:  Last INR 1.6. Repeat INR pending.  9.  Deep vein thrombosis prophylaxis with Protonix. 10.  Since she has a chronic obstructive pulmonary disease exacerbation, we are going to give her steroids, but is going to be a prednisone taper as she was taking earlier. She is not wheezing today.   TIME SPENT:  I spent about 50 minutes with this patient today.   ____________________________ Felipa Furnaceoberto Sanchez Gutierrez, MD rsg:cb D: 01/09/2013 16:10:30 ET T: 01/09/2013 16:45:56 ET JOB#: [id number]  cc: Felipa Furnaceoberto Sanchez Gutierrez, MD, <Dictator> Tamaka Sawin Juanda ChanceSANCHEZ GUTIERRE MD ELECTRONICALLY SIGNED 01/13/2013 20:19

## 2014-10-20 NOTE — Discharge Summary (Signed)
PATIENT NAME:  Kristy Cordova, Kristy Cordova MR#:  [id number] DATE OF BIRTH:  [birth year]  DATE OF ADMISSION:  01/09/2013 DATE OF DISCHARGE:  01/10/2013  PRIMARY CARE PHYSICIAN:[facility].   CHIEF COMPLAINT: Fall and increasing shortness of breath with cough, feeling weak with fever.   HISTORY OF PRESENT ILLNESS: Kristy Cordova is a 59 year old female with history of unresectable hepatocellular carcinoma diagnosed 2 months ago. She was started on Nexavar. It was recently stopped due to intractable nausea and vomiting. She comes in to the Emergency Room after she had a fall, feeling weak and had fever at home along with increasing shortness of breath. She was recently discharged with intractable nausea and vomiting back to home and she was doing well on 07/13. The patient was found to be developing early pneumonia. She had a fall and had laceration of her chin, which was sutured in the Emergency Room. She had been declining for the last few months rather rapidly and admitting physician spoke with family at length and initial decision was to send the patient  home with the patient's brother and hospice be arranged; however, family then decided the patient would rather be admitted and then they will have a family meeting along with palliative care and hospice screening. The patient was admitted on the medical floor, started on broad-spectrum antibiotics along with IV fluids. She remained lethargic most of the time, although did have a short conversation, but was very short of breath during that time. Hospice screening was done. Jeanie from hospice outpatient spoke with the patient's brother, sister and ex-husband and they were all in agreement that the patient has been declining due to multiple medical issues including her chronic cirrhosis of liver and hepatocellular carcinoma. They were in agreement with the patient returning home with brother with hospice following up. The patient was then discharged to home and hospice  services were set up. Hospital bed and portable oxygen was provided.   DISCHARGE DIAGNOSES:  1.  Pneumonia.  2.  Chronic obstructive pulmonary disease, on oxygen.   3.  Unresectable hepatocellular carcinoma.  4.  Chronic cirrhosis of liver with chronic ascites with peritoneal drain.  5.  CODE STATUS: No code, do-not-resuscitate.   MEDICATIONS:  1.  ranitidine 300 mg at bedtime.  2,  OxyContin 40 mg extended release 2 tablets in the morning and 1 in the afternoon.  3.  Oxycodone 10 mg 1 tablet every 6 hours as needed.  4.  Zofran 8 mg 1 tablet Cordova.i.d. as needed.  5.  Nexium 40 mg daily.  6.  Morphine 20 mg per mL oral concentrate 0.5 mL every 2 hours as needed.  7.  Tussionex 5 mL Cordova.i.d.  8.  Xanax 0.5 mg t.i.d. as needed.   DIET: Mechanical soft diet. Hospice to follow up at home.   LABS AT DISCHARGE: White count was 11.2, hemoglobin and hematocrit is 13.2 and 39.6, platelet count is 37. Bilirubin is 6.8, alkaline phosphatase is 156, SGPT is 358, SGOT 652, total protein is 4.7. Blood cultures no growth in 18 hours. PT-INR is 19.9 and 1.7.   TIME SPENT: 40 minutes.  ____________________________ Wylie HailSona A. Allena KatzPatel, MD sap:aw D: 01/11/2013 07:35:58 ET T: 01/11/2013 07:59:04 ET JOB#: [id number]  cc: Aithana Kushner A. Allena KatzPatel, MD, <Dictator> Willow OraSONA A Laysha Childers MD ELECTRONICALLY SIGNED 01/17/2013 19:07

## 2014-10-20 NOTE — H&P (Signed)
PATIENT NAME:  Kristy Cordova, Kristy Cordova MR#:  [id number] DATE OF BIRTH:  03/11/1956  DATE OF ADMISSION:  01/05/2013  PR[facility] CARE [facility]IC[facility]   CHIEF COMPLAINT: Intractable nausea, vomiting for 3 to 4 days.   HISTORY OF PRESENT ILLNESS: Kristy Cordova is a 59 year old Caucasian female with history of alcoholic cirrhosis of liver with chronic ascites, status post self-drainage of ascitic fluid at home with history of unresectable hepatocellular carcinoma, recently started 2 weeks ago on oral chemo with Nexavar. The patient comes in today with intractable nausea, vomiting for last few days, unable to keep anything orally. She clinically appears significantly dehydrated and is being admitted for further evaluation and management. The patient also reports having this dry hacking cough, which she reports likely picked up from one of her family members who had URI. The patient's blood pressure is stable. She is clinically dehydrated, being admitted for intractable nausea and vomiting.   PAST MEDICAL HISTORY:  1.  Unre[facility], is started on Nexavar 200 mg twice a day.  2.  Chronic pain.  3.  GERD. 4.  Alcoholic cirrhosis of liver. The patient has chronic ascites, status post catheter placement in the abdomen for intermittent drainage, which is done by patient.  5.  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  6.  Osteoarthritis.  7.  History of hepatitis C.   PAST SURGICAL HISTORY: 1.  Bilateral foot surgery.  2.  Bilateral carpal tunnel release.   ALLERGIES: ASPIRIN, BENADRYL, PENICILLIN, SUCCINYLCHOLINE AND SULFA.   MEDICATIONS: 1.  Cyclobenzaprine 10 mg twice a day as needed.  2.  Nexavar 200 mg Cordova.i.d.  3.  Zofran 8 mg Cordova.i.d. as needed for nausea, vomiting.  4.  Oxycodone 10 mg 1 tablet every 6 hours as needed for pain.  5.  OxyContin 40 mg 2 tablets once a day in the morning and 1 tablet  every afternoon.  6.  Ranitidine 300 mg at bedtime.   SOCIAL HISTORY: Smokes about 1/2 pack a day. Lives alone. She is separated from her husband. Denies alcohol use. She quit in the first of the year.   FAMILY HISTORY: Father with lung cancer. Brother with melanoma. Mother with ovarian cancer.   REVIEW OF SYSTEMS:    CONSTITUTIONAL: Positive for fatigue, weakness, weight loss.  EYES: No blurred or double vision, glaucoma or cataracts.  EARS, NOSE, THROAT: No tinnitus, ear pain, hearing loss.  RESPIRATORY: No cough, wheeze, hemoptysis, COPD.  CARDIOVASCULAR: No chest pain, orthopnea, edema or palpitations.  GASTROINTESTINAL: Positive for nausea, vomiting and diarrhea. Positive for abdominal cramping and GERD. GENITOURINARY: No dysuria, hematuria or frequency.   ENDOCRINE: No polyuria, nocturia or thyroid problems.  HEMATOLOGIC: Positive for chronic anemia and thrombocytopenia.  SKIN: No acne. Positive for ecchymosis.  MUSCULOSKELETAL: Positive for arthritis and back pain.  NEUROLOGIC: No CVA, TIA, tremors or ataxia.  PSYCHIATRIC: No anxiety or depression.   All other systems reviewed and negative.   PHYSICAL EXAMINATION: GENERAL: The patient is awake. She is alert. She is oriented x 3. She appears cachectic, thin, ill-looking and clinically dehydrated.  VITAL SIGNS: Temperature 99.7. Pulse is 102. Blood pressure is 122/69. Sats 92% on 2 liters nasal cannula oxygen.  HEENT: Atraumatic, normocephalic. Pupils: PERRLA. Icterus present bilaterally. Oral mucosa is dry.  NECK: Supple. No JVD. No carotid bruit.  LUNGS: Clear to auscultation bilaterally. Decreased breath sounds at the bases. No respiratory distress.  CARDIOVASCULAR: Both  heart sounds are normal. Rate, rhythm is regular. PMI not lateralized. Chest nontender.  EXTREMITIES: There is 1+ pitting edema in both lower extremities. Good pedal pulses, good femoral pulses.  ABDOMEN: Distended, nontender. There is ascites present with an  abdominal paracentesis drain present. Skin over the abdomen looks normal. There is no guarding or rigidity. Could not appreciate any hepatosplenomegaly. NEUROLOGIC: Grossly intact cranial nerves II through XII. No motor or sensory deficit.  PSYCHIATRIC: The patient is awake, alert, oriented x 3.  SKIN: Warm and dry.   LABORATORY AND RADIOLOGICAL DATA: Chest x-ray shows no acute cardiopulmonary abnormality. White count is 6.9; hemoglobin and hematocrit are 12.9 and 37.8; platelet count is 446. Glucose is 113, BUN 22, creatinine 0.92, sodium 140, potassium 3.2, chloride 108, bicarbonate 18. Calcium is 8.3. Bilirubin is 4.6. SGOT is 60. PT-INR are 19.3 and 1.6.   ASSESSMENT: A 59 year old Kristy Cordova with history of unresectable liver, cancer on chemotherapy for the last 2 weeks with Nexavar, along with history of alcoholic cirrhosis of liver with chronic ascites with chronic peritoneal drain, comes to the Emergency Room with:  1.  Intractable nausea and vomiting, suspected from the patient's chemotherapy, which is Nexavar: The patient says she has been having on-and-off nausea, vomiting and diarrhea since she started Nexavar 2 weeks ago. Her primary oncologist is Dr. Dixie Dials at [facility]. I tried to contact them, unable to get in touch with him. Will start the patient on intravenous fluids, clear liquid and ice chips. As-needed Zofran and Phenergan alternated. Hold off chemotherapy drug at this time. Advance diet as tolerated.  2.  Hypokalemia: Replace potassium.  3.  Elevated liver enzymes along with chronic hyperbilirubinemia secondary to cirrhosis of liver, alcoholic, with ascites and chronic peritoneal drain present: Will allow patient to drain her peritoneal/ascitic fluid since it has started leaking. The patient's ex-husband is going to bring supplies for her.  4.  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  5.  Chronic thrombocytopenia, again due to cirrhosis of liver.  6.   Unresectable hepatocellular carcinoma: The patient is on chemotherapy with Nexavar. Will hold off chemotherapy drug right now since the patient is likely having side effects from it. I tried to page the patient's nurse, Arizona Constable. Her pager number is [phone]. I did not get any response from her.   Further work-up according to the patient's clinical course. Hospital admission plan was discussed the patient and her ex-husband.   TIME SPENT: 50 minutes.    ____________________________ Wylie Hail Allena Katz, MD sap:jm D: 01/05/2013 15:34:54 ET T: 01/05/2013 15:55:13 ET JOB#: [id number]  cc: Kyanne Rials A. Allena Katz, MD, <Dictator> Dr. Dixie Dials, [facility] Willow Ora MD ELECTRONICALLY SIGNED 01/17/2013 19:02

## 2014-10-20 NOTE — Discharge Summary (Signed)
PATIENT NAME:  [name], Kristy B MR#:  [id number] DATE OF BIRTH:  10/20/1955  DATE OF ADMISSION:  01/05/2013 DATE OF DISCHARGE:  01/08/2013  PRESENTING COMPLAINT: Cough, intractable nausea and vomiting.   DISCHARGE DIAGNOSES: 1. Acute bronchitis.  2. Chronic obstructive pulmonary disease flare.  3. Intractable nausea and vomiting, resolved.  4. Dehydration, resolved.  5. Hepatocellular carcinoma. The patient's Nexavar on hold.  6. Chronic ascites.   CODE STATUS: Full code.   MEDICATIONS: 1. Cyclobenzaprine 10 mg 1 tablet b.i.d. as needed.  2. Ranitidine 300 mg daily at bedtime.  3. OxyContin 40 mg extended release 2 tablets once a day in the morning and 1 tablet every afternoon.  4. Oxycodone 10 mg every 6 hours as needed.  5. Zofran 8 mg 1 tablet b.i.d. as needed.  6. Guaifenesin 600 mg extended-release 1 tablet b.i.d.  7. Levaquin 500 p.o. daily.  8. Prednisone taper.   [facility] in 1 to 2 weeks.   Follow up with her PCP in 1 to 2 weeks.   White count is 6.9, H and H is 13.0 and 38.0, platelet count is 38, MCV is 101. Glucose is 118, BUN is 23, creatinine is 0.75, sodium is 135, potassium is 4.7, chloride is 105.   Chest x-ray is interstitial infiltrate.   UA negative for UTI.   BRIEF SUMMARY OF HOSPITAL COURSE: [name] is a pleasant 59 year old Caucasian female with history of hepatocellular carcinoma, comes in with intractable nausea and vomiting with diarrhea. It appears to be secondary to likely side effects of chemotherapy versus viral gastroenteritis. The patient was kept n.p.o. initially, was given antiemetics around the clock along with IV fluids. Her symptoms improved. She was eating well prior to discharge.   1. Unresectable hepatocellular carcinoma [facility] [place], started on Nexavar. Will keep off Nexavar until the patient [facility]. I did speak with the patient's nurse, Joann, with [facility] and she will make an  appointment for the patient to be seen in the clinic.  2. COPD exacerbation, p.o. steroids were given.  3. Acute bronchitis, treated with Levaquin.  4. Chronic pain. The patient is on OxyContin and oxycodone p.r.n.  5. GERD. PPI was continued.   Hospital stay otherwise remained stable. THE PATIENT REMAINED A FULL CODE.   TIME SPENT: Forty minutes.  ____________________________ Wylie HailSona A. Allena KatzPatel, MD sap:aw D: 01/09/2013 07:36:53 ET T: 01/09/2013 09:06:21 ET JOB#: [id number]  cc: Doni Widmer A. Allena KatzPatel, MD, <Dictator> Willow OraSONA A Gurdeep Keesey MD ELECTRONICALLY SIGNED 01/17/2013 19:02

## 2014-10-22 NOTE — Op Note (Signed)
PATIENT NAME:  Kristy Cordova, Kristy Cordova MR#:  [id number] DATE OF BIRTH:  06-28-1956  DATE OF PROCEDURE:  09/11/2011  PREOPERATIVE DIAGNOSIS: Rotator cuff tear, left shoulder with AC joint arthritis and impingement.   POSTOPERATIVE DIAGNOSES:  1. Rotator cuff tear, left shoulder. 2. AC joint arthritis, left shoulder.  3. Impingement syndrome, left shoulder.  4. Labral tear, left shoulder, with synovitis of the glenohumeral joint.   PROCEDURES:  1. Arthroscopic repair of rotator cuff tear, left shoulder.  2. Arthroscopic subacromial decompression with release of coracoacromial ligament.  3. Arthroscopic resection of distal clavicle.  4. Arthroscopic debridement of glenohumeral joint synovitis and labral tear.   SURGEON: Winn JockJames C. Carmella Kees, MD  ASSISTANT: None.   ANESTHESIA: General.   ESTIMATED BLOOD LOSS: Negligible.   COMPLICATIONS: None.   IMPLANTS USED: Two 6.5 mm ArthroCare speed screws.   BRIEF CLINICAL NOTE AND PATHOLOGY: The patient had documented rotator cuff tear with retraction, AC joint arthritis and impingement. Options, risks, and benefits were discussed in detail. At time of the procedure there was significant retraction but it could be brought down nicely to its insertion. There was very significant impingement. There was significant AC joint arthritis. There were minimal glenohumeral arthritic changes. There is no evidence of instability.   DESCRIPTION OF PROCEDURE: Adequate general anesthesia, beach chair position, routine prepping and draping. Appropriate timeout was called. The arthroscope introduced through routine posterior portal after shoulder was infiltrated with Marcaine with epinephrine. An anterior portal created with guidance of a spinal needle. Glenohumeral joint was inspected with above-mentioned pathology found. Debridement of the labral tear and the synovitis was performed.   The undersurface of the rotator cuff tear was noted.   The arthroscope then passed into  the subacromial space. Subacromial decompression was performed after the bursa was removed. The bur was used in the en bloc technique.   The cuff tear edges were further delineated. Two sutures were then placed with the arthroscopic technique. Two anchors were placed and the sutures were tightened appropriately bringing the cuff down nicely to its insertion. Prior to this the area of the footplate adjacent to the articular surface was debrided to bleeding bone.   The distal clavicle was then resected from inferior to superior leaving the dorsal ligaments intact. The shoulder was taken through a full range of motion with no evidence of impingement. Hemostasis was good. The portals were closed with nylon. The shoulder was injected with Marcaine with epinephrine. Soft sterile dressing was applied followed by a sling with a bolster. Patient was awakened, taken to the postanesthesia care unit having tolerated the procedure well.    ____________________________ Winn JockJames C. Gerrit Heckaliff, MD jcc:cms D: 09/11/2011 14:23:40 ET T: 09/11/2011 15:56:12 ET JOB#: [id number]  cc: Winn JockJames C. Gerrit Heckaliff, MD, <Dictator> Winn JockJAMES C Ilona Colley MD ELECTRONICALLY SIGNED 09/13/2011 10:07
